# Patient Record
Sex: Male | Born: 1980 | Race: Black or African American | Hispanic: No | Marital: Single | Smoking: Never smoker
Health system: Southern US, Community
[De-identification: ages and names within clinical notes are randomized; demographics above are authoritative.]

---

## 2006-01-30 ENCOUNTER — Emergency Department (HOSPITAL_COMMUNITY): Admission: EM | Admit: 2006-01-30 | Discharge: 2006-01-30 | Payer: Self-pay | Admitting: Family Medicine

## 2006-04-09 ENCOUNTER — Emergency Department (HOSPITAL_COMMUNITY): Admission: EM | Admit: 2006-04-09 | Discharge: 2006-04-09 | Payer: Self-pay | Admitting: Emergency Medicine

## 2006-11-10 ENCOUNTER — Emergency Department (HOSPITAL_COMMUNITY): Admission: EM | Admit: 2006-11-10 | Discharge: 2006-11-10 | Payer: Self-pay | Admitting: Family Medicine

## 2006-12-13 ENCOUNTER — Emergency Department (HOSPITAL_COMMUNITY): Admission: EM | Admit: 2006-12-13 | Discharge: 2006-12-13 | Payer: Self-pay | Admitting: Emergency Medicine

## 2007-05-07 ENCOUNTER — Emergency Department (HOSPITAL_COMMUNITY): Admission: EM | Admit: 2007-05-07 | Discharge: 2007-05-07 | Payer: Self-pay | Admitting: Emergency Medicine

## 2009-01-16 ENCOUNTER — Emergency Department (HOSPITAL_COMMUNITY): Admission: EM | Admit: 2009-01-16 | Discharge: 2009-01-16 | Payer: Self-pay | Admitting: Emergency Medicine

## 2010-10-04 ENCOUNTER — Emergency Department (HOSPITAL_COMMUNITY)
Admission: EM | Admit: 2010-10-04 | Discharge: 2010-10-04 | Payer: Self-pay | Source: Home / Self Care | Admitting: Emergency Medicine

## 2010-12-31 LAB — GC/CHLAMYDIA PROBE AMP, GENITAL: GC Probe Amp, Genital: NEGATIVE

## 2011-06-13 ENCOUNTER — Emergency Department (HOSPITAL_COMMUNITY): Payer: Self-pay

## 2011-06-13 ENCOUNTER — Emergency Department (HOSPITAL_COMMUNITY)
Admission: EM | Admit: 2011-06-13 | Discharge: 2011-06-13 | Disposition: A | Discharge status: Home / Self Care | Payer: Self-pay | Attending: Emergency Medicine | Admitting: Emergency Medicine

## 2011-06-13 DIAGNOSIS — R059 Cough, unspecified: Secondary | ICD-10-CM | POA: Insufficient documentation

## 2011-06-13 DIAGNOSIS — R630 Anorexia: Secondary | ICD-10-CM | POA: Insufficient documentation

## 2011-06-13 DIAGNOSIS — IMO0001 Reserved for inherently not codable concepts without codable children: Secondary | ICD-10-CM | POA: Insufficient documentation

## 2011-06-13 DIAGNOSIS — R42 Dizziness and giddiness: Secondary | ICD-10-CM | POA: Insufficient documentation

## 2011-06-13 DIAGNOSIS — J189 Pneumonia, unspecified organism: Secondary | ICD-10-CM | POA: Insufficient documentation

## 2011-06-13 DIAGNOSIS — R509 Fever, unspecified: Secondary | ICD-10-CM | POA: Insufficient documentation

## 2011-06-13 DIAGNOSIS — R110 Nausea: Secondary | ICD-10-CM | POA: Insufficient documentation

## 2011-06-13 DIAGNOSIS — R51 Headache: Secondary | ICD-10-CM | POA: Insufficient documentation

## 2011-06-13 DIAGNOSIS — R05 Cough: Secondary | ICD-10-CM | POA: Insufficient documentation

## 2011-06-13 LAB — COMPREHENSIVE METABOLIC PANEL
BUN: 12 mg/dL (ref 6–23)
CO2: 26 mEq/L (ref 19–32)
Calcium: 9.3 mg/dL (ref 8.4–10.5)
GFR calc Af Amer: >60 mL/min (ref >60)
GFR calc non Af Amer: >60 mL/min (ref >60)
Glucose, Bld: 98 mg/dL (ref 70–99)
Total Protein: 7.1 g/dL (ref 6.0–8.3)

## 2011-06-13 LAB — DIFFERENTIAL
Basophils Relative: 0 % (ref 0–1)
Lymphocytes Relative: 14 % (ref 12–46)
Lymphs Abs: 1.1 10*3/uL (ref 0.7–4.0)
Monocytes Absolute: 1 10*3/uL (ref 0.1–1.0)
Monocytes Relative: 12 % (ref 3–12)
Neutro Abs: 5.7 10*3/uL (ref 1.7–7.7)

## 2011-06-13 LAB — CBC
HCT: 40.6 % (ref 39.0–52.0)
Hemoglobin: 13.6 g/dL (ref 13.0–17.0)
MCH: 27.5 pg (ref 26.0–34.0)
MCHC: 33.5 g/dL (ref 30.0–36.0)
MCV: 82 fL (ref 78.0–100.0)

## 2011-06-13 LAB — MONONUCLEOSIS SCREEN: Mono Screen: NEGATIVE

## 2011-08-05 LAB — HERPES SIMPLEX VIRUS CULTURE

## 2011-08-05 LAB — POCT URINALYSIS DIP (DEVICE)
Bilirubin Urine: NEGATIVE
Glucose, UA: NEGATIVE
Nitrite: NEGATIVE
Urobilinogen, UA: 0.2
pH: 5.5

## 2011-08-05 LAB — GC/CHLAMYDIA PROBE AMP, GENITAL: Chlamydia, DNA Probe: NEGATIVE

## 2012-05-25 IMAGING — CT CT HEAD W/O CM
1 series · 16 of 30 positions shown, 20 images · non-contrast
Comparison: None.

CLINICAL DATA: Headaches; fever and dizziness.

CT HEAD WITHOUT CONTRAST
TECHNIQUE: Contiguous axial images were obtained from the base of
the skull through the vertex without contrast.

[Series 2: head routine 4.8 h37s · axial · 0.45mm/px · z∈[-156,-1]mm · 16 of 36 slices shown, 20 images]
[im 2/36  brain]
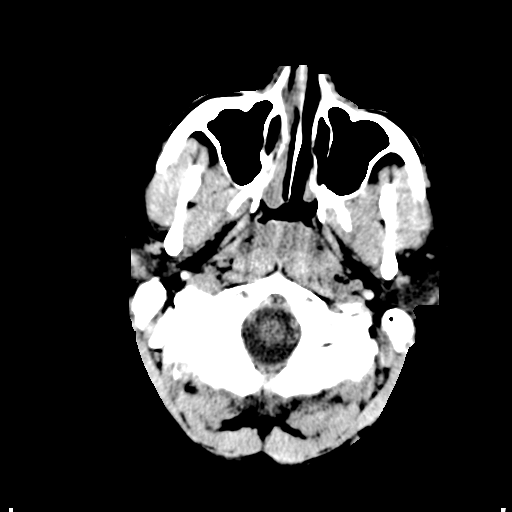
[im 2/36  bone]
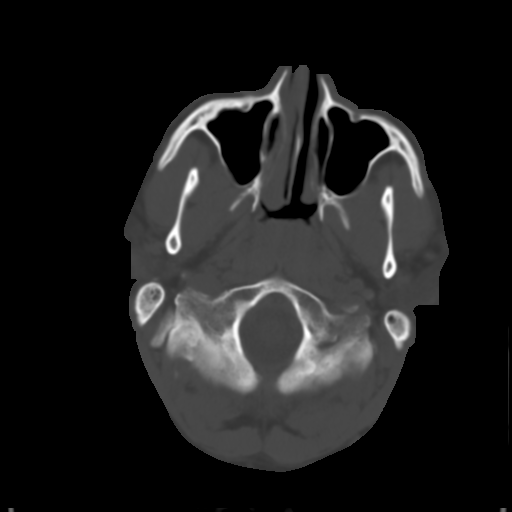
[im 4/36  brain]
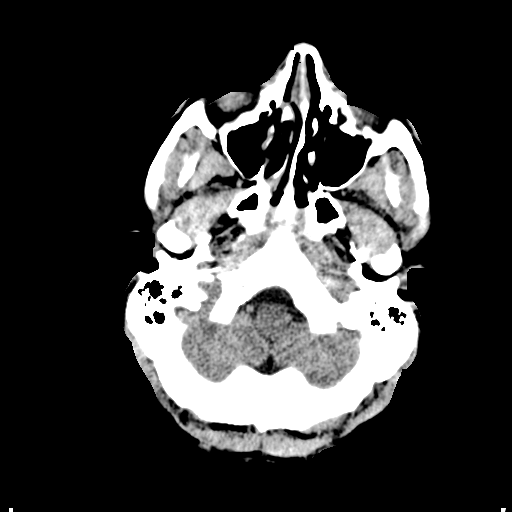
[im 7/36  brain]
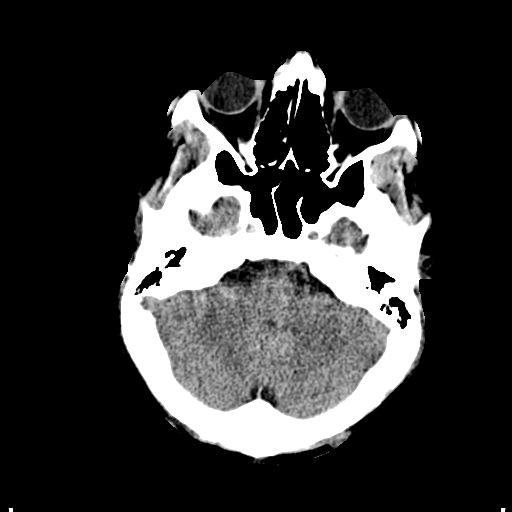
[im 9/36  brain]
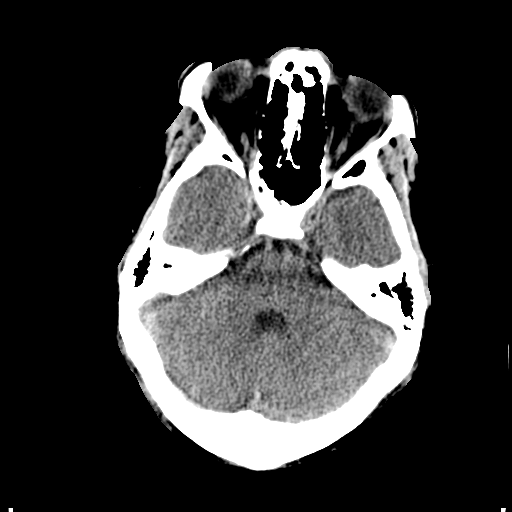
[im 10/36  brain]
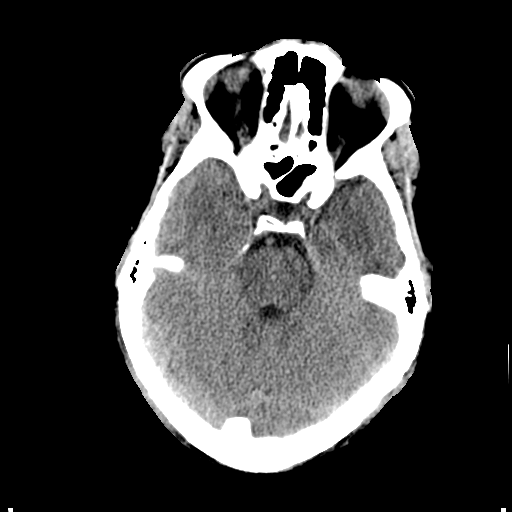
[im 10/36  bone]
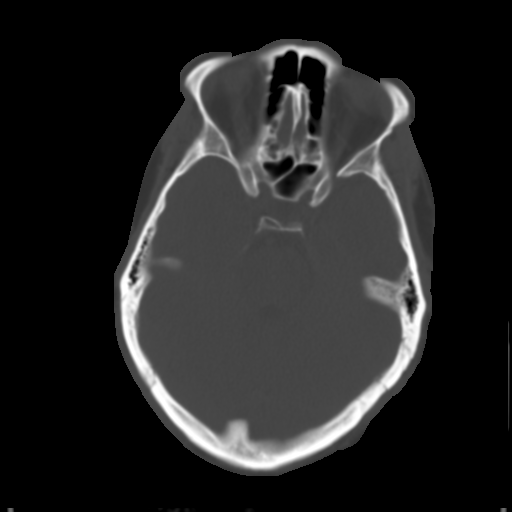
[im 13/36  brain]
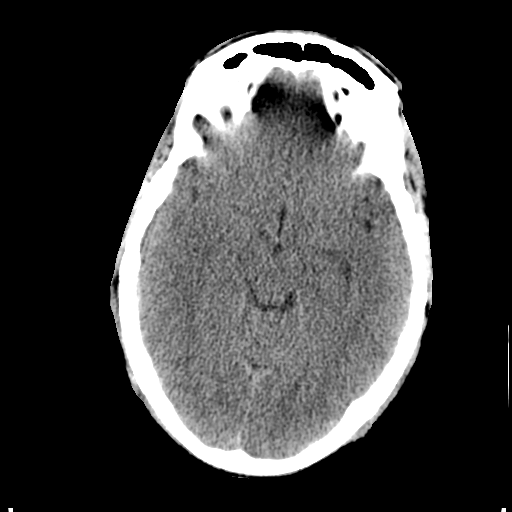
[im 15/36  brain]
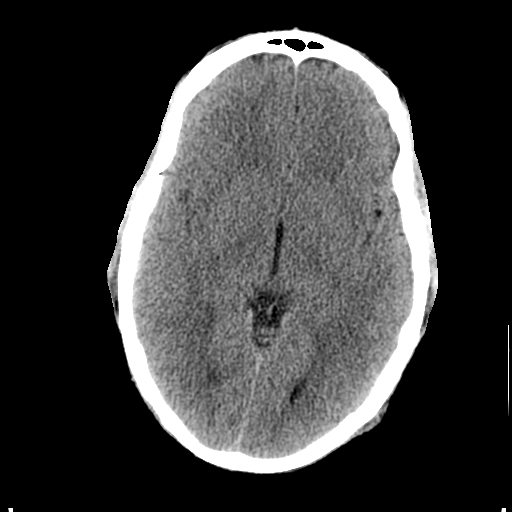
[im 17/36  brain]
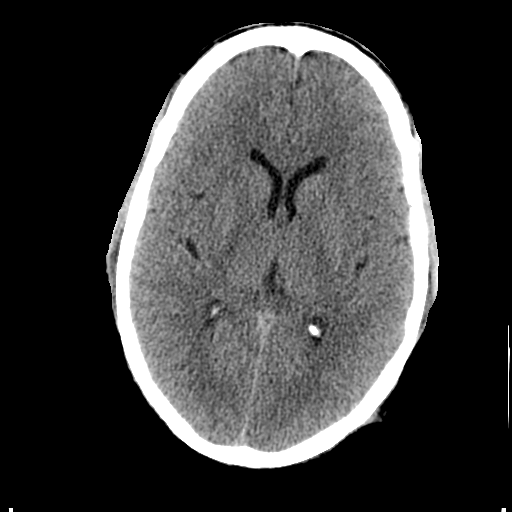
[im 19/36  brain]
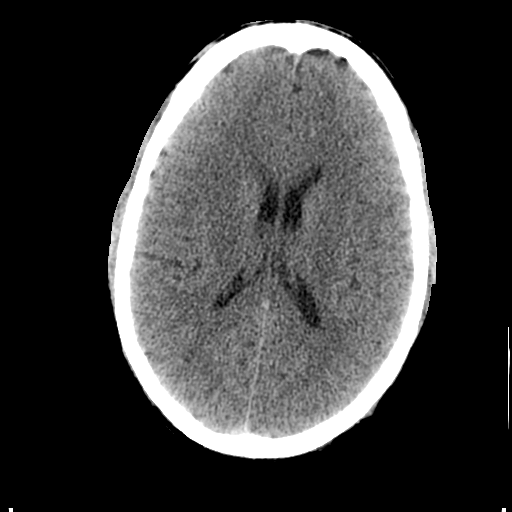
[im 19/36  bone]
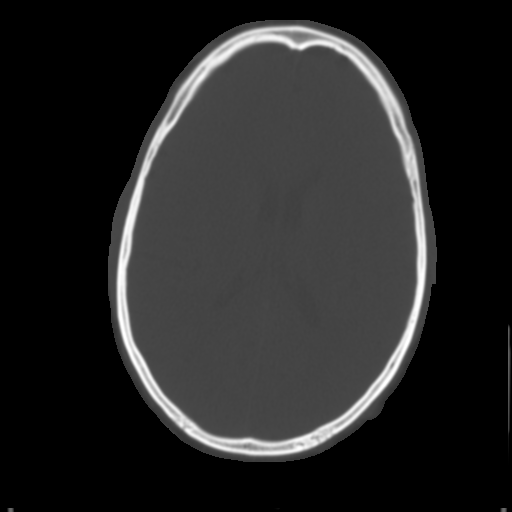
[im 21/36  brain]
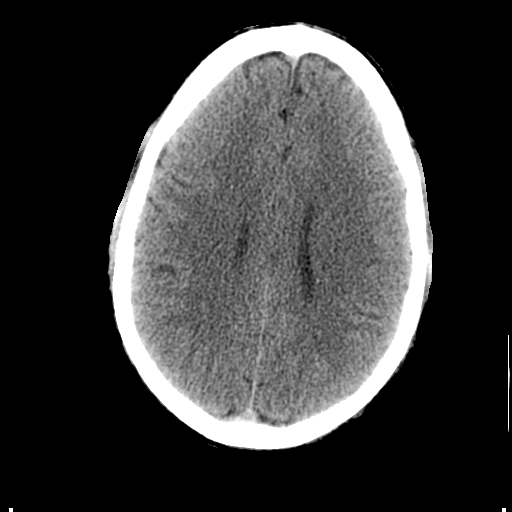
[im 23/36  brain]
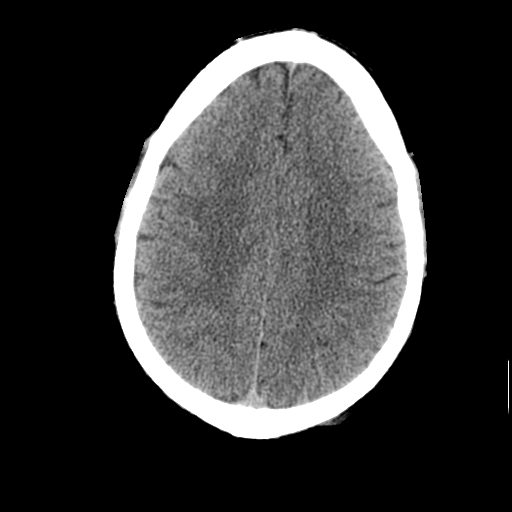
[im 26/36  brain]
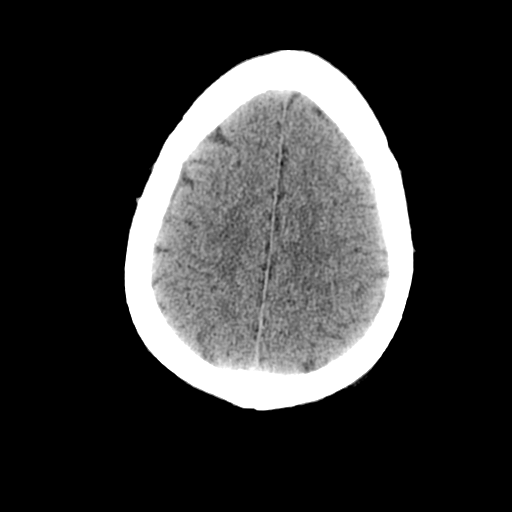
[im 27/36  brain]
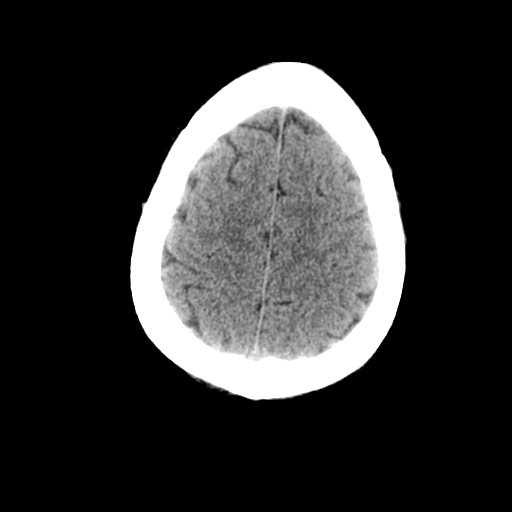
[im 27/36  bone]
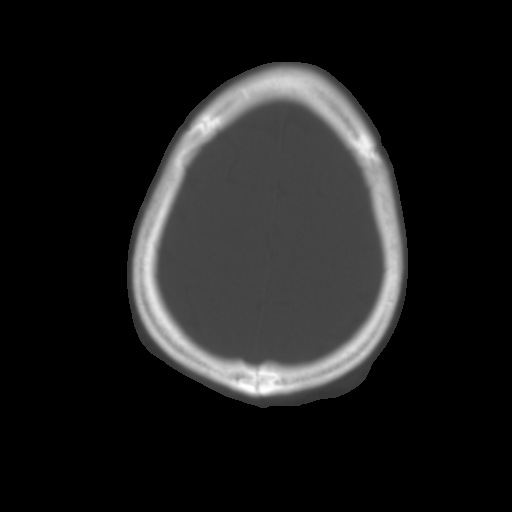
[im 29/36  brain]
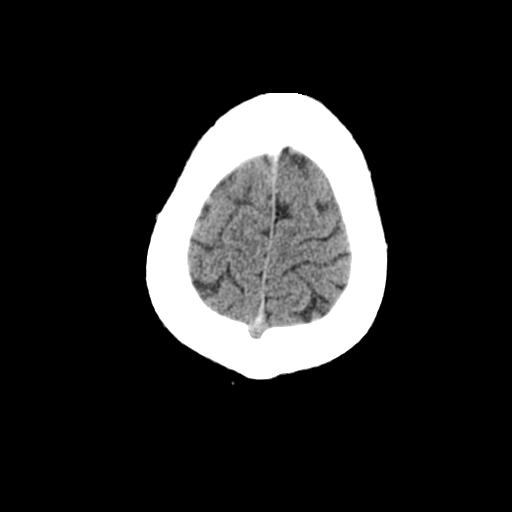
[im 32/36  brain]
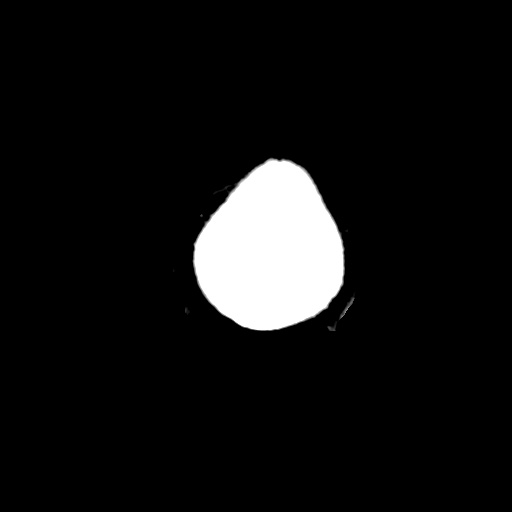
[im 34/36  brain]
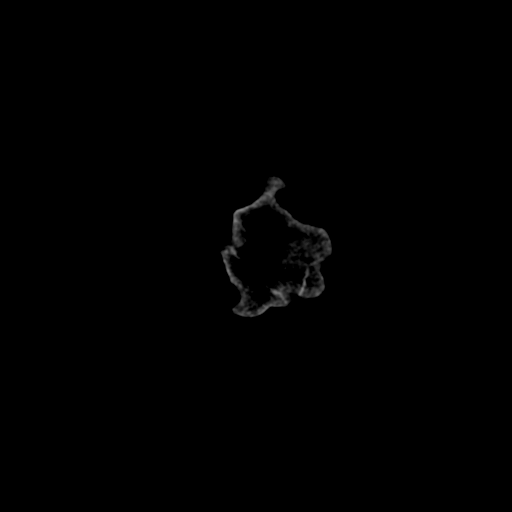

[16 of 30 positions shown; findings below may reference images not displayed]

FINDINGS: There is no evidence of acute infarction, mass lesion, or
intra- or extra-axial hemorrhage on CT.

The posterior fossa, including the cerebellum, brainstem and fourth
ventricle, is within normal limits.  The third and lateral
ventricles, and basal ganglia are unremarkable in appearance.  The
cerebral hemispheres are symmetric in appearance, with normal gray-
white differentiation.  No mass effect or midline shift is seen.

There is no evidence of fracture; visualized osseous structures are
unremarkable in appearance.  The orbits are within normal limits.
The paranasal sinuses and mastoid air cells are well-aerated.  No
significant soft tissue abnormalities are seen.
IMPRESSION: Unremarkable noncontrast CT of the head.

## 2023-04-22 ENCOUNTER — Telehealth: Payer: Self-pay

## 2023-04-22 NOTE — Telephone Encounter (Signed)
Received referral from Dr Thornell Mule at [facility]. For OSA on CPAP - needs new machine. Placed in sleep mailbox

## 2023-05-26 ENCOUNTER — Encounter: Payer: Self-pay | Admitting: Neurology

## 2023-05-26 ENCOUNTER — Ambulatory Visit: Payer: 59 | Admitting: Neurology

## 2023-05-26 VITALS — BP 167/96 | HR 55 | Ht 73.0 in | Wt 220.0 lb

## 2023-05-26 DIAGNOSIS — Z87898 Personal history of other specified conditions: Secondary | ICD-10-CM | POA: Diagnosis not present

## 2023-05-26 DIAGNOSIS — G4726 Circadian rhythm sleep disorder, shift work type: Secondary | ICD-10-CM | POA: Diagnosis not present

## 2023-05-26 DIAGNOSIS — G4719 Other hypersomnia: Secondary | ICD-10-CM | POA: Diagnosis not present

## 2023-05-26 DIAGNOSIS — G4733 Obstructive sleep apnea (adult) (pediatric): Secondary | ICD-10-CM | POA: Diagnosis not present

## 2023-05-26 NOTE — Patient Instructions (Signed)
The patient is a highly compliant CPAP user his current machine is an AirSense 10 AutoSet with a serial #[id number] and settings between 8 and 13 cm of water without EPR, residual AHI of 3.6.  His 95th percentile pressure is 13 cm water so he would definitely need a little bit more pressure air leak is minimal 11.2 L a minute.  He is a full facemask user.  He needs a new machine and a new baseline- HST was ordered while not on CPAP for the night of the test.  Screening for Sleep Apnea  Sleep apnea is a condition in which breathing pauses or becomes shallow during sleep. Sleep apnea screening is a test to determine if you are at risk for sleep apnea. The test includes a series of questions. It will only takes a few minutes. Your health care provider may ask you to have this test in preparation for surgery or as part of a physical exam. What are the symptoms of sleep apnea? Common symptoms of sleep apnea include: Snoring. Waking up often at night. Daytime sleepiness. Pauses in breathing. Choking or gasping during sleep. Irritability. Forgetfulness. Trouble thinking clearly. Depression. Personality changes. Most people with sleep apnea do not know that they have it. What are the advantages of sleep apnea screening? Getting screened for sleep apnea can help: Ensure your safety. It is important for your health care providers to know whether or not you have sleep apnea, especially if you are having surgery or have other long-term (chronic) health conditions. Improve your health and allow you to get a better night's rest. Restful sleep can help you: Have more energy. Lose weight. Improve high blood pressure. Improve diabetes management. Prevent stroke. Prevent car accidents. What happens during the screening? Screening usually includes being asked a list of questions about your sleep quality. Some questions you may be asked include: Do you snore? Is your sleep restless? Do you have  daytime sleepiness? Has a partner or spouse told you that you stop breathing during sleep? Have you had trouble concentrating or memory loss? What is your age? What is your neck circumference? To measure your neck, keep your back straight and gently wrap the tape measure around your neck. Put the tape measure at the middle of your neck, between your chin and collarbone. What is your sex assigned at birth? Do you have or are you being treated for high blood pressure? If your screening test is positive, you are at risk for the condition. Further testing may be needed to confirm a diagnosis of sleep apnea. Where to find more information You can find screening tools online or at your health care clinic. For more information about sleep apnea screening and healthy sleep, visit these websites: Centers for Disease Control and Prevention: FootballExhibition.com.br American Sleep Apnea Association: www.sleepapnea.org Contact a health care provider if: You think that you may have sleep apnea. Summary Sleep apnea screening can help determine if you are at risk for sleep apnea. It is important for your health care providers to know whether or not you have sleep apnea, especially if you are having surgery or have other chronic health conditions. You may be asked to take a screening test for sleep apnea in preparation for surgery or as part of a physical exam. This information is not intended to replace advice given to you by your health care provider. Make sure you discuss any questions you have with your health care provider. Document Revised: 09/15/2020 Document Reviewed: 09/15/2020 Elsevier  Patient Education  2024 [employer].

## 2023-05-26 NOTE — Progress Notes (Signed)
SLEEP MEDICINE CLINIC    Provider:  Melvyn Novas, MD  Primary Care Physician:  Charlane Ferretti, DO [address] 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   Chief concern according to patient :  I need a new CPAP, this is one issued in 2015-. Last sleep test was 2015.   Norman Bowen is a 42 year -old man with a medica history of OSA , HTN, cholesterolemia treated on medication,Hypo-of heart murmur, history of vitamin B12 deficiency which is now supplemented, prediabetic in the past and started on metformin, he has a sickle cell trait, he has been a compliant CPAP user since 2015.  The patient is a highly compliant CPAP user his current machine is an AirSense 10 AutoSet with a serial #[id number] and settings between 8 and 13 cm of water without EPR, residual AHI of 3.6.  His 95th percentile pressure is 13 cm water so he would definitely need a little bit more pressure air leak is minimal 11.2 L a minute.  He is a full facemask user.  As to his family history his mother is alive 42 years old and has a history of diabetes and high blood pressure his father has the same conditions age 76 and had coronary artery disease with a quad bypass surgery.  Maternal grandfather had prostate cancer.  His father suffered a stroke in April of this year 2024 and was [place], [place].    Social history:  Patient is working as a Clinical biochemist, 2 weeks night shift 2 weeks early shift or dayshift.  These are 12-hour shifts and lives in a  household with spouse and 2 daughters , both 67 years old-  The patient currently works full time- / used to work in shifts( Chief Technology Officer,) Pets : none.  Tobacco use: non smoker,  but has used marihuana  ETOH use - none ,  Caffeine intake: none - sometimes juice, mostly water.  Exercise in form of : regular at the gym.       Sleep habits are as follows: The patient's dinner time is variable due to his shift work..  In a nightshift -period, the patient goes to bed at  9 AM and continues to sleep for 4-6 hours, The preferred sleep position is side sleeper, with the support of 2 pillows.  His bedroom is quiet and dark and cool.  Dreams are reportedly frequent, Paxil seems to have  introduced more late sleep dreams.   The patient wakes up spontaneously/or with an alarm. 4 PM is the usual rise time, works from 7-7-. Marland Kitchen He reports not feeling refreshed or restored but sleeps much better with CPAP than without.  without symptoms such as dry mouth, morning headaches, and residual fatigue.  Naps are taken infrequently, lasting from 20 to 30 minutes and are refreshing . He uses a CPAP with  his naps.   Review of Systems: Out of a complete 14 system review, the patient complains of only the following symptoms, and all other reviewed systems are negative.:  Fatigue,  shift work, dreaming vividly in the last hour of sleep,EDS- anxiety    How likely are you to doze in the following situations: 0 = not likely, 1 = slight chance, 2 = moderate chance, 3 = high chance   Sitting and Reading? Watching Television? Sitting inactive in a public place (theater or meeting)? As a passenger in a car for an hour without a break? Lying down in the afternoon when circumstances permit? Sitting and talking to someone? Sitting quietly after lunch without alcohol? In a car, while stopped for a few minutes in traffic?   Total = 14/ 24 points   FSS endorsed at 12/ 63 points.   Social History   Socioeconomic  History   Marital status: Single    Spouse name: Not on file   Number of children: Not on file   Years of education: Not on file   Highest education level: Not on file  Occupational History   Not on file  Tobacco Use   Smoking status: Not on file   Smokeless tobacco: Not on file  Substance and Sexual Activity   Alcohol use: Not on file   Drug use: Not on file   Sexual activity: Not on file  Other Topics Concern   Not on file  Social History Narrative   Not on file   Social Determinants of Health   Financial Resource Strain: Not on file  Food Insecurity: Not on file  Transportation Needs: Not on file  Physical Activity: Not on file  Stress: Not on file  Social Connections: Not on file     Current Outpatient Medications on File Prior to Visit  Medication Sig Dispense Refill   acyclovir (ZOVIRAX) 400 MG tablet Take 400 mg by mouth daily.     amLODipine (NORVASC) 5 MG tablet Take 5 mg by mouth daily.     amLODipine-benazepril (LOTREL) 5-20 MG capsule Take 1 capsule by mouth.     atorvastatin (LIPITOR) 20 MG tablet Take 20 mg by mouth daily.     metFORMIN (GLUCOPHAGE-XR) 500 MG 24 hr tablet Take 500 mg by mouth 2 (two) times daily with a meal.     metoprolol succinate (TOPROL-XL) 100 MG 24 hr tablet Take 150 mg by mouth daily.     PARoxetine (PAXIL) 10 MG tablet Take 10 mg by mouth daily.     No current facility-administered medications on file prior to visit.    No Known Allergies   DIAGNOSTIC DATA (LABS, IMAGING, TESTING) - I reviewed patient records, labs, notes, testing and imaging myself where available.  The patient is a highly compliant CPAP user his current machine is an AirSense 10 AutoSet with a serial #[id number] and settings between 8 and 13 cm of water without EPR, residual AHI of 3.6.  His 95th percentile pressure is 13 cm water so he would definitely need a little bit more pressure air leak is minimal 11.2 L a minute.  He is a full facemask  user.   Lab Results  Component Value Date   WBC 7.8 06/13/2011   HGB 13.6 06/13/2011   HCT 40.6 06/13/2011   MCV 82.0 06/13/2011   PLT 205 06/13/2011      Component Value Date/Time   NA 136 06/13/2011 0417   K 4.0 06/13/2011 0417  CL 101 06/13/2011 0417   CO2 26 06/13/2011 0417   GLUCOSE 98 06/13/2011 0417   BUN 12 06/13/2011 0417   CREATININE 1.40 (H) 06/13/2011 0417   CALCIUM 9.3 06/13/2011 0417   PROT 7.1 06/13/2011 0417   ALBUMIN 3.9 06/13/2011 0417   AST 23 06/13/2011 0417   ALT 21 06/13/2011 0417   ALKPHOS 72 06/13/2011 0417   BILITOT 0.4 06/13/2011 0417   GFRNONAA >60 06/13/2011 0417   GFRAA >60 06/13/2011 0417   No results found for: "CHOL", "HDL", "LDLCALC", "LDLDIRECT", "TRIG", "CHOLHDL" No results found for: "HGBA1C" No results found for: "VITAMINB12" No results found for: "TSH"  The patient has excellent cholesterol control 108 total cholesterol level, vitamin B12 level 682, TSH 1.8, last bilirubin 0.9 in normal range dated 04-11-2023, normal AST and ALT, BUN, potassium, chloride, sodium  CBC from 11 April 2023 white blood cell count 5.2 no abnormality in the differential no neutrophilia mono failure eosinophilia.  Hemoglobin was 13.8, hematocrit was 43.7, MCV was 92.  Triglycerides were 70 which is in normal range, HbA1c is still in prediabetes range at 5.9.  PHYSICAL EXAM:  Today's Vitals   05/26/23 1454  BP: (!) 167/96  Pulse: (!) 55  Weight: 220 lb (99.8 kg)  Height: 6\' 1"  (1.854 m)   Body mass index is 29.03 kg/m.   Wt Readings from Last 3 Encounters:  05/26/23 220 lb (99.8 kg)     Ht Readings from Last 3 Encounters:  05/26/23 6\' 1"  (1.854 m)      General: The patient is awake, alert and appears not in acute distress. The patient is well groomed. Head: Normocephalic, atraumatic. Neck is supple.   Mallampati 3,  neck circumference:17 inches . Nasal airflow left patent, right congested.  Retrognathia is not seen. Facial hair. .  Dental  status:  biological  Cardiovascular:  Regular rate and cardiac rhythm by pulse,  without distended neck veins. Respiratory: Lungs are clear to auscultation.  Skin:  Without evidence of ankle edema, or rash. Trunk: The patient's posture is erect.   NEUROLOGIC EXAM: The patient is awake and alert, oriented to place and time.   Memory subjective described as intact.  Attention span & concentration ability appears normal.  Speech is fluent, without  dysarthria, dysphonia or aphasia.  Mood and affect are appropriate.   Cranial nerves: no loss of smell or taste reported  Pupils are equal and briskly reactive to light. Funduscopic exam deferred. .  Extraocular movements in vertical and horizontal planes were intact and without nystagmus. No Diplopia. Visual fields by finger perimetry are intact. Hearing was intact to soft voice and finger rubbing.    Facial sensation intact to fine touch.  Facial motor strength is symmetric and tongue and uvula move midline.  Neck ROM : rotation, tilt and flexion extension were normal for age and shoulder shrug was symmetrical.    Motor exam:  Symmetric bulk, tone and ROM.   Normal tone without cog-wheeling, symmetric grip strength .   Sensory:  Fine touch and vibration were normal.  Proprioception tested in the upper extremities was normal.   Gait and station: Patient could rise unassisted from a seated position, walked without assistive device.  Deep tendon reflexes: in the  upper and lower extremities are symmetric and intact.      ASSESSMENT AND PLAN 42 y.o. year old male  here with:    1) History of OSA on CPAP testing and CPAP machine currently used are from 2015-  2)  I will order a HST for the near future - and we will than issue a new auto CPAP.   3) he is interested in keeping his current machine as a travel CPAP.   I plan to follow up either personally or through our NP within 3-5 months.  I would like to thank Charlane Ferretti, DO for  allowing me to meet with and to take care of this pleasant patient.    After spending a total time of  30  minutes face to face and additional time for physical and neurologic examination, review of laboratory studies,  personal review of imaging studies, reports and results of other testing and review of referral information / records as far as provided in visit,   Electronically signed by: Melvyn Novas, MD 05/26/2023 3:18 PM  [facility] Board certified by The [employer] of Sleep Medicine and Diplomate of the [employer] of Sleep Medicine. Board certified In Neurology through the ABPN, Fellow of the [employer] of Neurology.

## 2023-06-04 ENCOUNTER — Ambulatory Visit: Payer: 59 | Admitting: Neurology

## 2023-06-04 DIAGNOSIS — G4719 Other hypersomnia: Secondary | ICD-10-CM

## 2023-06-04 DIAGNOSIS — G4733 Obstructive sleep apnea (adult) (pediatric): Secondary | ICD-10-CM | POA: Diagnosis not present

## 2023-06-04 DIAGNOSIS — G4726 Circadian rhythm sleep disorder, shift work type: Secondary | ICD-10-CM

## 2023-06-04 DIAGNOSIS — Z87898 Personal history of other specified conditions: Secondary | ICD-10-CM

## 2023-06-12 NOTE — Progress Notes (Signed)
Norman Bowen 42 year old male 1981-07-03 [facility]   HOME SLEEP TEST REPORT ( MAIL OUT by Watch PAT)   STUDY DATE:  06-14-2023    ORDERING CLINICIAN: Melvyn Novas, MD  REFERRING CLINICIAN: PCP   CLINICAL INFORMATION/HISTORY: 05-26-2023, referred by PCP, Dr Thornell Mule, DO  [name] is a 42 year -old man with a medica history of OSA , HTN, cholesterolemia treated on medication,Hypo-of heart murmur, history of vitamin B12 deficiency which is now supplemented, prediabetic in the past and started on metformin, he has a sickle cell trait, he has been a compliant CPAP user since 2015.   The patient is a highly compliant CPAP user his current machine is an AirSense 10 AutoSet with a serial #[id number] and settings between 8 and 13 cm of water without EPR, residual AHI of 3.6.  His 95th percentile pressure is 13 cm water so he would definitely need a little bit more pressure air leak is minimal 11.2 L a minute.  He is a full facemask user. He needs a new CPAP.       Epworth sleepiness score: 14/ 24 points   FSS endorsed at 12/ 63 points.    BMI:  29.2kg/m   Neck Circumference: 17   FINDINGS:   Sleep Summary:   Total Recording Time (hours, min):     7 hours 48 minutes   Total Sleep Time (hours, min):    6 hours 39 minutes             Percent REM (%):     24.7%                                   Respiratory Indices:   Calculated pAHI (per hour):    39.1/h                         REM pAHI: 36.3/h                                               NREM pAHI: 40.1/h                            Supine AHI:   This patient slept for the majority of the night in nonsupine sleep position there were 99 minutes of sleep on the right side with an AHI of 15.3/h versus 190 minutes on his left side with an AHI of 45.4/h supine sleep followed with 109 minutes with an AHI of 50.4/h.         Snoring reached a mean volume of 47 dB which is loud and was present for 55% of total  recorded sleep time.                                         Oxygen Saturation Statistics:   O2 Saturation Range (%): Saturation ranged between a nadir at 79% and a maximum saturation of 100% with a mean saturation of 95%  O2 Saturation (minutes) <89%:    2.2 minutes       Pulse Rate Statistics:   Pulse Mean (bpm):    53 bpm             Pulse Range:    Between 40 and a maximum of 91 bpm.  An artifact had previously stated a minimum heart rate of 31 bpm.             IMPRESSION:  This HST confirms the presence of severe obstructive sleep apnea without any central component.  This sleep disordered breathing disorder is associated with hypoxia and the minimum oxygen saturation of 79% it is also associated with intermittent bradycardia.  I would like for the patient to continue CPAP therapy.   RECOMMENDATION: The patient will be placed on an auto titration CPAP device with a setting between 6 and 18 cm water pressure 3 cm EPR heated humidification and mask of his choice.  He is an experienced CPAP user and has been compliant.    INTERPRETING PHYSICIAN:   Melvyn Novas, MD

## 2023-06-17 ENCOUNTER — Other Ambulatory Visit: Payer: Self-pay | Admitting: Neurology

## 2023-06-17 DIAGNOSIS — G4733 Obstructive sleep apnea (adult) (pediatric): Secondary | ICD-10-CM

## 2023-06-17 DIAGNOSIS — G4719 Other hypersomnia: Secondary | ICD-10-CM

## 2023-06-17 NOTE — Progress Notes (Signed)
DME order was sent, cc Dr Thornell Mule .

## 2023-06-17 NOTE — Procedures (Signed)
Norman Bowen 42 year old male 1981-07-03 [facility]   HOME SLEEP TEST REPORT ( MAIL OUT by Watch PAT)   STUDY DATE:  06-14-2023    ORDERING CLINICIAN: Melvyn Novas, MD  REFERRING CLINICIAN: PCP   CLINICAL INFORMATION/HISTORY: 05-26-2023, referred by PCP, Dr Thornell Mule, DO  [name] is a 42 year -old man with a medica history of OSA , HTN, cholesterolemia treated on medication,Hypo-of heart murmur, history of vitamin B12 deficiency which is now supplemented, prediabetic in the past and started on metformin, he has a sickle cell trait, he has been a compliant CPAP user since 2015.   The patient is a highly compliant CPAP user his current machine is an AirSense 10 AutoSet with a serial #[id number] and settings between 8 and 13 cm of water without EPR, residual AHI of 3.6.  His 95th percentile pressure is 13 cm water so he would definitely need a little bit more pressure air leak is minimal 11.2 L a minute.  He is a full facemask user. He needs a new CPAP.       Epworth sleepiness score: 14/ 24 points   FSS endorsed at 12/ 63 points.    BMI:  29.2kg/m   Neck Circumference: 17   FINDINGS:   Sleep Summary:   Total Recording Time (hours, min):     7 hours 48 minutes   Total Sleep Time (hours, min):    6 hours 39 minutes             Percent REM (%):     24.7%                                   Respiratory Indices:   Calculated pAHI (per hour):    39.1/h                         REM pAHI: 36.3/h                                               NREM pAHI: 40.1/h                            Supine AHI:   This patient slept for the majority of the night in nonsupine sleep position there were 99 minutes of sleep on the right side with an AHI of 15.3/h versus 190 minutes on his left side with an AHI of 45.4/h supine sleep followed with 109 minutes with an AHI of 50.4/h.         Snoring reached a mean volume of 47 dB which is loud and was present for 55% of total  recorded sleep time.                                         Oxygen Saturation Statistics:   O2 Saturation Range (%): Saturation ranged between a nadir at 79% and a maximum saturation of 100% with a mean saturation of 95%  O2 Saturation (minutes) <89%:    2.2 minutes       Pulse Rate Statistics:   Pulse Mean (bpm):    53 bpm             Pulse Range:    Between 40 and a maximum of 91 bpm.  An artifact had previously stated a minimum heart rate of 31 bpm.             IMPRESSION:  This HST confirms the presence of severe obstructive sleep apnea without any central component.  This sleep disordered breathing disorder is associated with hypoxia and the minimum oxygen saturation of 79% it is also associated with intermittent bradycardia.  I would like for the patient to continue CPAP therapy.   RECOMMENDATION: The patient will be placed on an auto titration CPAP device with a setting between 6 and 18 cm water pressure 3 cm EPR heated humidification and mask of his choice.  He is an experienced CPAP user and has been compliant.    INTERPRETING PHYSICIAN:   Melvyn Novas, MD

## 2023-06-19 ENCOUNTER — Telehealth: Payer: Self-pay

## 2023-06-19 NOTE — Telephone Encounter (Signed)
-----   Message from Nurse Baird Lyons B sent at 06/19/2023  9:13 AM EDT -----  ----- Message ----- From: Melvyn Novas, MD Sent: 06/17/2023   1:02 PM EDT To: Charlane Ferretti, DO; Gna-Pod 3 Results  HST has confirmed severe OSA-  The patient will be placed on an auto titration CPAP device with a setting between 6 and 18 cm water pressure 3 cm EPR heated humidification and mask of his choice.    He is an experienced CPAP user and has been compliant.

## 2023-06-19 NOTE — Telephone Encounter (Signed)
Tried contacting patient. Unfortunately mailbox is full and unable to lvm. Will try to call patient back later today.

## 2023-06-26 NOTE — Telephone Encounter (Signed)
I called pt. I advised pt that Dr. Vickey Huger reviewed their sleep study results and found that pt has severe OSA. Dr. Vickey Huger recommends that pt starts auto CPAP. I reviewed PAP compliance expectations with the pt. Pt is agreeable to starting a CPAP. I advised pt that an order will be sent to a DME, [facility], and [facility] will call the pt within about one week after they file with the pt's insurance. [facility] will show the pt how to use the machine, fit for masks, and troubleshoot the CPAP if needed. A follow up appt was made for insurance purposes with Butch Penny, NP on 09/10/2023 at 11:30 am. Pt verbalized understanding to arrive 15 minutes early and bring their CPAP. Pt verbalized understanding of results. Pt had no questions at this time but was encouraged to call back if questions arise. I have sent the order to [facility] and have received confirmation that they have received the order.

## 2023-09-10 ENCOUNTER — Ambulatory Visit: Payer: 59 | Admitting: Adult Health

## 2023-09-10 VITALS — BP 140/86 | HR 49 | Ht 73.0 in | Wt 232.8 lb

## 2023-09-10 DIAGNOSIS — G4733 Obstructive sleep apnea (adult) (pediatric): Secondary | ICD-10-CM

## 2023-09-10 NOTE — Patient Instructions (Signed)
Continue using CPAP nightly and greater than 4 hours each night °If your symptoms worsen or you develop new symptoms please let us know.  ° °

## 2023-09-10 NOTE — Progress Notes (Addendum)
Order sent to [facility]. They have confirmed receipt of order.

## 2024-09-08 ENCOUNTER — Telehealth: Payer: Self-pay | Admitting: Adult Health

## 2024-09-08 NOTE — Telephone Encounter (Signed)
 VM box full, sent text msg informing pt of need to reschedule 09/13/24 appt - NP out

## 2024-09-13 ENCOUNTER — Ambulatory Visit: Payer: 59 | Admitting: Adult Health
